# Patient Record
Sex: Male | Born: 1990 | Race: Black or African American | Hispanic: No | Marital: Single | Smoking: Current every day smoker
Health system: Southern US, Community
[De-identification: ages and names within clinical notes are randomized; demographics above are authoritative.]

## PROBLEM LIST (undated history)

## (undated) DIAGNOSIS — F419 Anxiety disorder, unspecified: Secondary | ICD-10-CM

## (undated) DIAGNOSIS — L309 Dermatitis, unspecified: Secondary | ICD-10-CM

## (undated) HISTORY — DX: Dermatitis, unspecified: L30.9

## (undated) HISTORY — PX: TONSILLECTOMY: SUR1361

---
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
## 2013-04-21 ENCOUNTER — Emergency Department (HOSPITAL_COMMUNITY)
Admission: EM | Admit: 2013-04-21 | Discharge: 2013-04-21 | Disposition: A | Discharge status: Home / Self Care | Payer: Self-pay | Attending: Emergency Medicine | Admitting: Emergency Medicine

## 2013-04-21 ENCOUNTER — Emergency Department (HOSPITAL_COMMUNITY): Payer: Self-pay

## 2013-04-21 ENCOUNTER — Encounter (HOSPITAL_COMMUNITY): Payer: Self-pay | Admitting: Emergency Medicine

## 2013-04-21 DIAGNOSIS — F17200 Nicotine dependence, unspecified, uncomplicated: Secondary | ICD-10-CM | POA: Insufficient documentation

## 2013-04-21 DIAGNOSIS — IMO0002 Reserved for concepts with insufficient information to code with codable children: Secondary | ICD-10-CM | POA: Insufficient documentation

## 2013-04-21 DIAGNOSIS — Y929 Unspecified place or not applicable: Secondary | ICD-10-CM | POA: Insufficient documentation

## 2013-04-21 DIAGNOSIS — S63601A Unspecified sprain of right thumb, initial encounter: Secondary | ICD-10-CM

## 2013-04-21 DIAGNOSIS — Y939 Activity, unspecified: Secondary | ICD-10-CM | POA: Insufficient documentation

## 2013-04-21 DIAGNOSIS — S6390XA Sprain of unspecified part of unspecified wrist and hand, initial encounter: Secondary | ICD-10-CM | POA: Insufficient documentation

## 2013-04-21 DIAGNOSIS — R209 Unspecified disturbances of skin sensation: Secondary | ICD-10-CM | POA: Insufficient documentation

## 2013-04-21 NOTE — ED Provider Notes (Signed)
Medical screening examination/treatment/procedure(s) were performed by non-physician practitioner and as supervising physician I was immediately available for consultation/collaboration.   Richardean Canal, MD 04/21/13 [phone]
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
## 2013-04-21 NOTE — ED Notes (Signed)
Patient transported to X-ray 

## 2013-04-21 NOTE — Discharge Instructions (Signed)
See attached document for more information about your diagnosis. Take over the counter pain meds (aleve, ibuprofen, tylenol) for pain.  May take up to 800mg , 3 times a day. Follow-up with hand specialist, Dr. Melvyn Novas if symptoms do not improve. Return to the ED for new or worsening symptoms.

## 2013-11-22 ENCOUNTER — Ambulatory Visit (INDEPENDENT_AMBULATORY_CARE_PROVIDER_SITE_OTHER): Payer: Self-pay | Admitting: Family Medicine

## 2013-11-22 DIAGNOSIS — Z111 Encounter for screening for respiratory tuberculosis: Secondary | ICD-10-CM

## 2013-11-25 ENCOUNTER — Encounter: Payer: Self-pay | Admitting: Physician Assistant

## 2013-11-25 ENCOUNTER — Ambulatory Visit (INDEPENDENT_AMBULATORY_CARE_PROVIDER_SITE_OTHER): Payer: Self-pay | Admitting: Physician Assistant

## 2013-11-25 VITALS — BP 134/80 | HR 64 | Temp 98.4°F | Resp 18 | Ht 71.5 in | Wt 173.0 lb

## 2013-11-25 DIAGNOSIS — Z0000 Encounter for general adult medical examination without abnormal findings: Secondary | ICD-10-CM

## 2013-11-25 DIAGNOSIS — L259 Unspecified contact dermatitis, unspecified cause: Secondary | ICD-10-CM

## 2013-11-25 DIAGNOSIS — F410 Panic disorder [episodic paroxysmal anxiety] without agoraphobia: Secondary | ICD-10-CM

## 2013-11-25 DIAGNOSIS — L309 Dermatitis, unspecified: Secondary | ICD-10-CM

## 2013-11-25 LAB — TB SKIN TEST: Induration: 0 mm

## 2013-11-25 NOTE — Progress Notes (Signed)
Patient ID: Henry Schultz MRN: [id number], DOB: 02-03-1991, 22 y.o. Date of Encounter: @DATE @  Chief Complaint:  Chief Complaint  Patient presents with  . Annual Exam    read TB test,  wants to discuss situational anxiety    HPI: 22 y.o. year old AA male  presents for complete physical. Says that he has to have a physical because of a new job.  He has been seen in the office once prior to today's visit. This was August of 2013 with Dr. Modesto Charon. He was seen for complete physical exam at that time as well.  Today he reports that he has been having some episodes of panic. Says it first started this summer. He notices he has it when he is around a lot of people and sometimes when having a conversation with someone- he will  get this nervous feeling. If he starts to feel very nervoius he begins to sweat. Says it used to occur only about once per month but recently has been more of every 2 or 3 weeks. He is concerned that it is becoming more frequent. Says he tries to "think happy thoughts" but he cannot control it.  No other complaints. Says he was taking classes at [employer]. 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 says he has never had these panic episodes while driving.   Past Medical History  Diagnosis Date  . Eczema      Home Meds: See attached medication section for current medication list. Any medications entered into computer today will not appear on this note's list. The medications listed below were entered prior to today. No current outpatient prescriptions on file prior to visit.   No current facility-administered medications on file prior to visit.    Allergies: No Known Allergies  History   Social History  . Marital Status: Single    Spouse Name: N/A    Number of Children: N/A  . Years of Education: N/A   Occupational History  . Not on file.   Social History Main Topics  . Smoking status:  Current Every Day Smoker    Types: Cigarettes  . Smokeless tobacco: Never Used  . Alcohol Use: Yes  . Drug Use: No  . Sexual Activity: Not on file   Other Topics Concern  . Not on file   Social History Narrative  . No narrative on file    He exercises every day. Says that he does a "full workout every day. " he was a basketball player in the past. He currently does cardio as well as core exercises and weights. Both ofHis parents are living and are in good health. History reviewed. No pertinent family history.   Review of Systems:  See HPI for pertinent ROS. All other ROS negative.    Physical Exam: Blood pressure 134/80, pulse 64, temperature 98.4 F (36.9 C), temperature source Oral, resp. rate 18, height 5' 11.5" (1.816 m), weight 173 lb (78.472 kg)., Body mass index is 23.79 kg/(m^2). General: WNWD AAM Appears in no acute distress. Head: Normocephalic, atraumatic, eyes without discharge, sclera non-icteric, nares are without discharge. Bilateral auditory canals clear, TM's are without perforation, pearly grey and translucent with reflective cone of light bilaterally. Oral cavity moist, posterior pharynx without exudate, erythema, peritonsillar abscess, or post nasal drip.  Neck: Supple. No thyromegaly. No lymphadenopathy. Lungs: Clear bilaterally to auscultation without wheezes, rales, or rhonchi. Breathing is unlabored. Heart:  RRR with S1 S2. No murmurs, rubs, or gallops. Abdomen: Soft, non-tender, non-distended with normoactive bowel sounds. No hepatomegaly. No rebound/guarding. No obvious abdominal masses. Musculoskeletal:  Strength and tone normal for age. Extremities/Skin: Warm and dry. No clubbing or cyanosis. No edema. No rashes or suspicious lesions. Neuro: Alert and oriented X 3. Moves all extremities spontaneously. Gait is normal. CNII-XII grossly in tact. Psych:  Responds to questions appropriately with a normal affect.     ASSESSMENT AND PLAN:  22 y.o. year old  male with  1. Visit for preventive health examination Complete lab work done August 2013. It was normal at that time. We do not need to repeat now. Immunizations: Tetanus is up-to-date. He had this in 2006.  2. Panic disorder Discussed the options for treatment. Discussed counseling/therapy. Discussed daily SSRI. Discussed when necessary benzodiazepines. Patient defers all of these treatment options at this time. Told him to start documenting the dates and understands this at any episodes of panic from here on out. Document these and bring them in for followup visit.  3. Eczema    Signed, [address] [place], [place], [facility] 11/25/2013 12:18 PM

## 2014-05-08 IMAGING — CR DG FINGER THUMB 2+V*R*
3 series · 3 of 3 positions shown · non-contrast
Comparison: None.

CLINICAL DATA: Pain post injury

RIGHT THUMB 2+V

[x finger pa right]
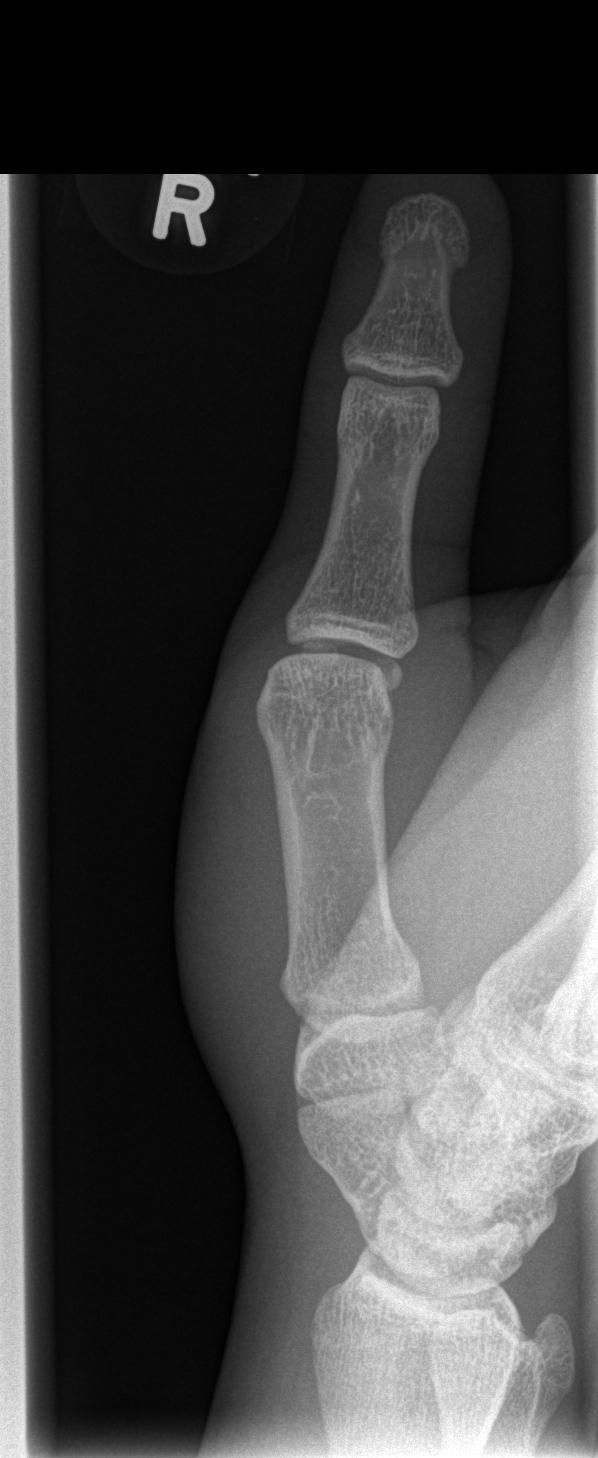

[x finger obl. right]
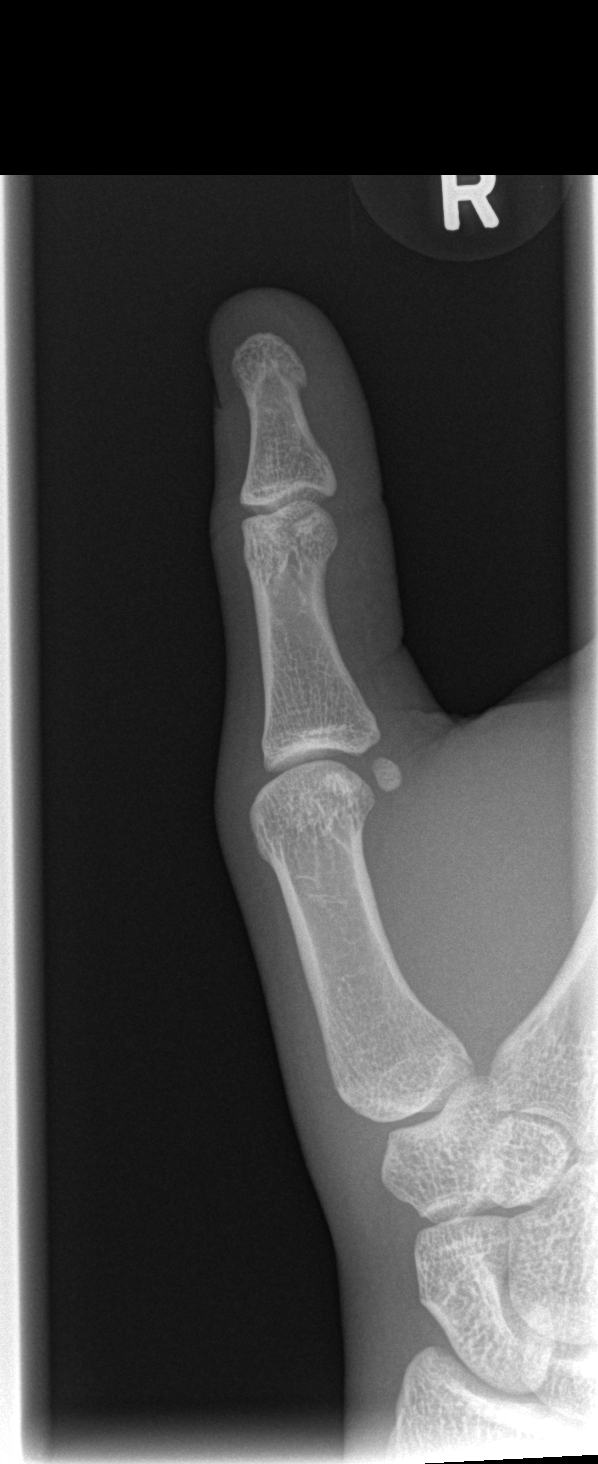

[x finger lateral right]
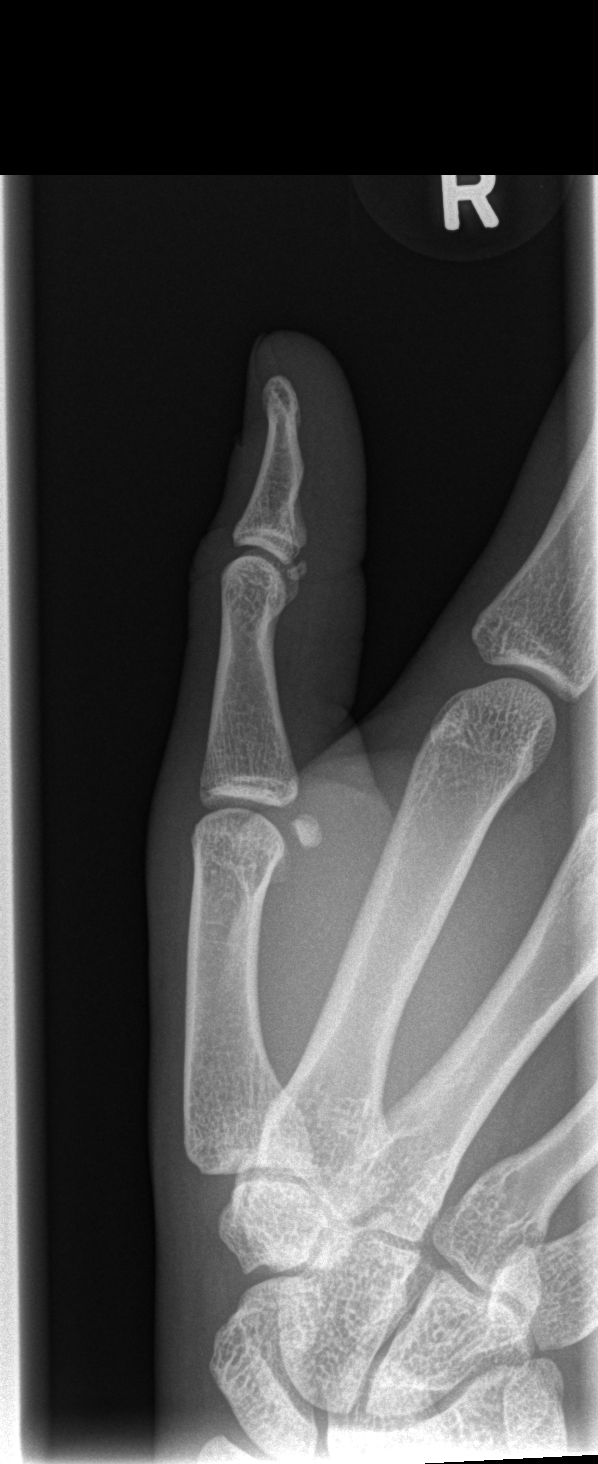

[3 of 3 positions shown; findings below may reference images not displayed]

FINDINGS: Three views of the right thumb submitted.  No acute
fracture or subluxation.  No radiopaque foreign body.
IMPRESSION: No acute fracture or subluxation.

## 2014-06-27 ENCOUNTER — Emergency Department (HOSPITAL_COMMUNITY)
Admission: EM | Admit: 2014-06-27 | Discharge: 2014-06-27 | Disposition: A | Discharge status: Home / Self Care | Payer: Self-pay | Attending: Emergency Medicine | Admitting: Emergency Medicine

## 2014-06-27 ENCOUNTER — Encounter (HOSPITAL_COMMUNITY): Payer: Self-pay | Admitting: Emergency Medicine

## 2014-06-27 ENCOUNTER — Emergency Department (HOSPITAL_COMMUNITY): Payer: Self-pay

## 2014-06-27 DIAGNOSIS — F17200 Nicotine dependence, unspecified, uncomplicated: Secondary | ICD-10-CM | POA: Insufficient documentation

## 2014-06-27 DIAGNOSIS — Y9389 Activity, other specified: Secondary | ICD-10-CM | POA: Insufficient documentation

## 2014-06-27 DIAGNOSIS — Z872 Personal history of diseases of the skin and subcutaneous tissue: Secondary | ICD-10-CM | POA: Insufficient documentation

## 2014-06-27 DIAGNOSIS — M2180 Other specified acquired deformities of unspecified limb: Secondary | ICD-10-CM | POA: Insufficient documentation

## 2014-06-27 DIAGNOSIS — Y929 Unspecified place or not applicable: Secondary | ICD-10-CM | POA: Insufficient documentation

## 2014-06-27 DIAGNOSIS — Z79899 Other long term (current) drug therapy: Secondary | ICD-10-CM | POA: Insufficient documentation

## 2014-06-27 DIAGNOSIS — M21242 Flexion deformity, left finger joints: Secondary | ICD-10-CM

## 2014-06-27 DIAGNOSIS — X58XXXA Exposure to other specified factors, initial encounter: Secondary | ICD-10-CM | POA: Insufficient documentation

## 2014-06-27 MED ORDER — OXYCODONE-ACETAMINOPHEN 5-325 MG PO TABS
1.0000 | ORAL_TABLET | Freq: Once | ORAL | Status: AC
  Administered 2014-06-27: 1 via ORAL
  Filled 2014-06-27: qty 1

## 2014-06-27 MED ORDER — IBUPROFEN 600 MG PO TABS: 600.0000 mg | ORAL_TABLET | Freq: Three times a day (TID) | ORAL | Status: DC | PRN
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
## 2015-03-14 ENCOUNTER — Other Ambulatory Visit: Payer: Self-pay

## 2015-03-14 ENCOUNTER — Emergency Department (HOSPITAL_COMMUNITY)
Admission: EM | Admit: 2015-03-14 | Discharge: 2015-03-14 | Disposition: A | Discharge status: Home / Self Care | Payer: No Typology Code available for payment source | Attending: Emergency Medicine | Admitting: Emergency Medicine

## 2015-03-14 ENCOUNTER — Encounter (HOSPITAL_COMMUNITY): Payer: Self-pay | Admitting: *Deleted

## 2015-03-14 DIAGNOSIS — F419 Anxiety disorder, unspecified: Secondary | ICD-10-CM | POA: Diagnosis present

## 2015-03-14 DIAGNOSIS — R079 Chest pain, unspecified: Secondary | ICD-10-CM | POA: Insufficient documentation

## 2015-03-14 DIAGNOSIS — Z872 Personal history of diseases of the skin and subcutaneous tissue: Secondary | ICD-10-CM | POA: Diagnosis not present

## 2015-03-14 DIAGNOSIS — F410 Panic disorder [episodic paroxysmal anxiety] without agoraphobia: Secondary | ICD-10-CM | POA: Diagnosis not present

## 2015-03-14 DIAGNOSIS — Z720 Tobacco use: Secondary | ICD-10-CM | POA: Diagnosis not present

## 2015-03-14 HISTORY — DX: Anxiety disorder, unspecified: F41.9
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
## 2015-03-14 NOTE — Discharge Instructions (Signed)

## 2015-07-14 IMAGING — CR DG FINGER LITTLE 2+V*L*
3 series · 3 of 3 positions shown · non-contrast
Comparison: Left fifth finger series dated May 11, 2009

CLINICAL DATA: Acute left fifth digit injury with history of
multiple previous injuries

EXAM:
LEFT LITTLE FINGER 2+V

[x finger pa left]
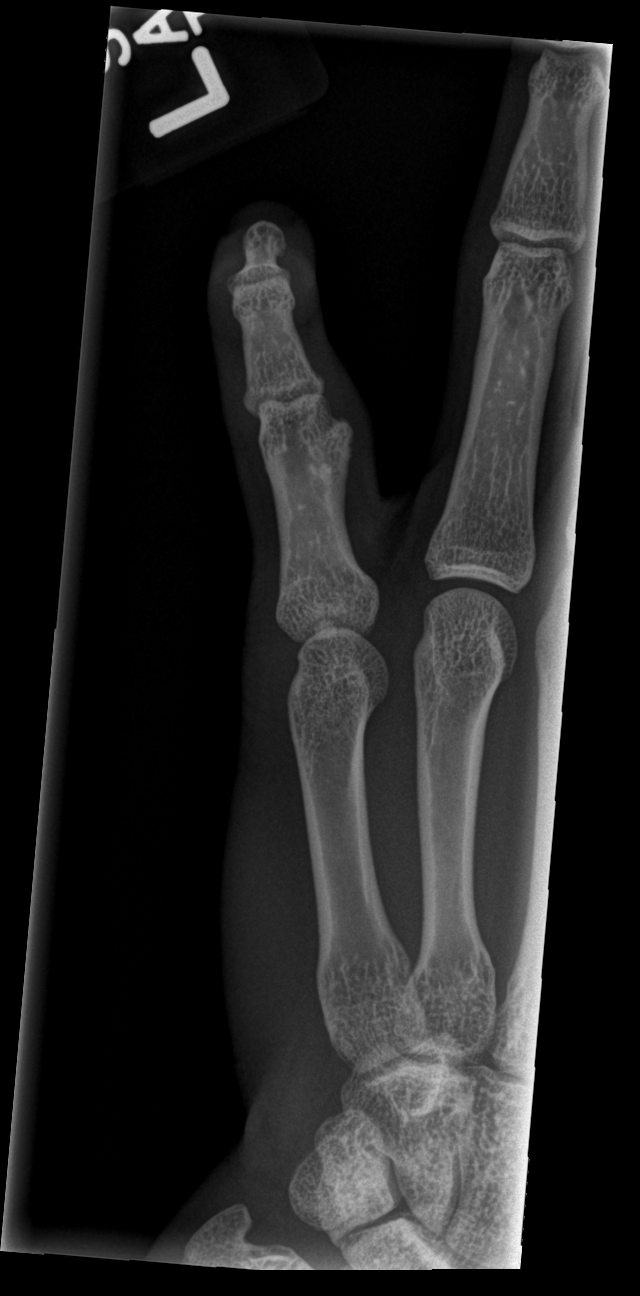

[x finger obl left]
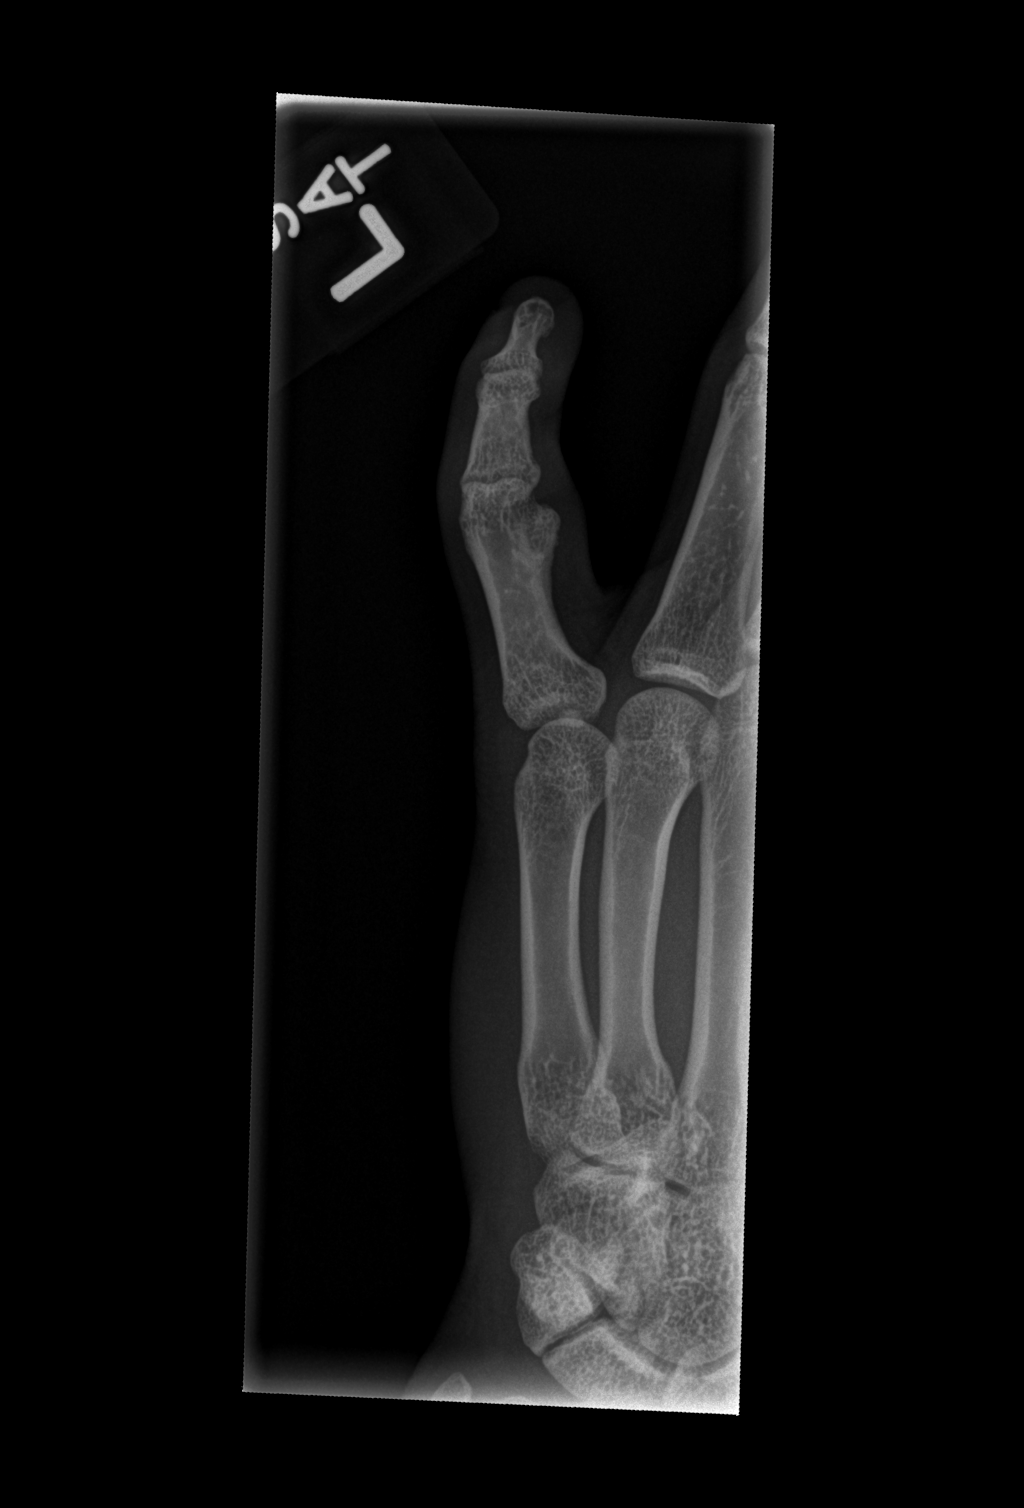

[x finger lat left]
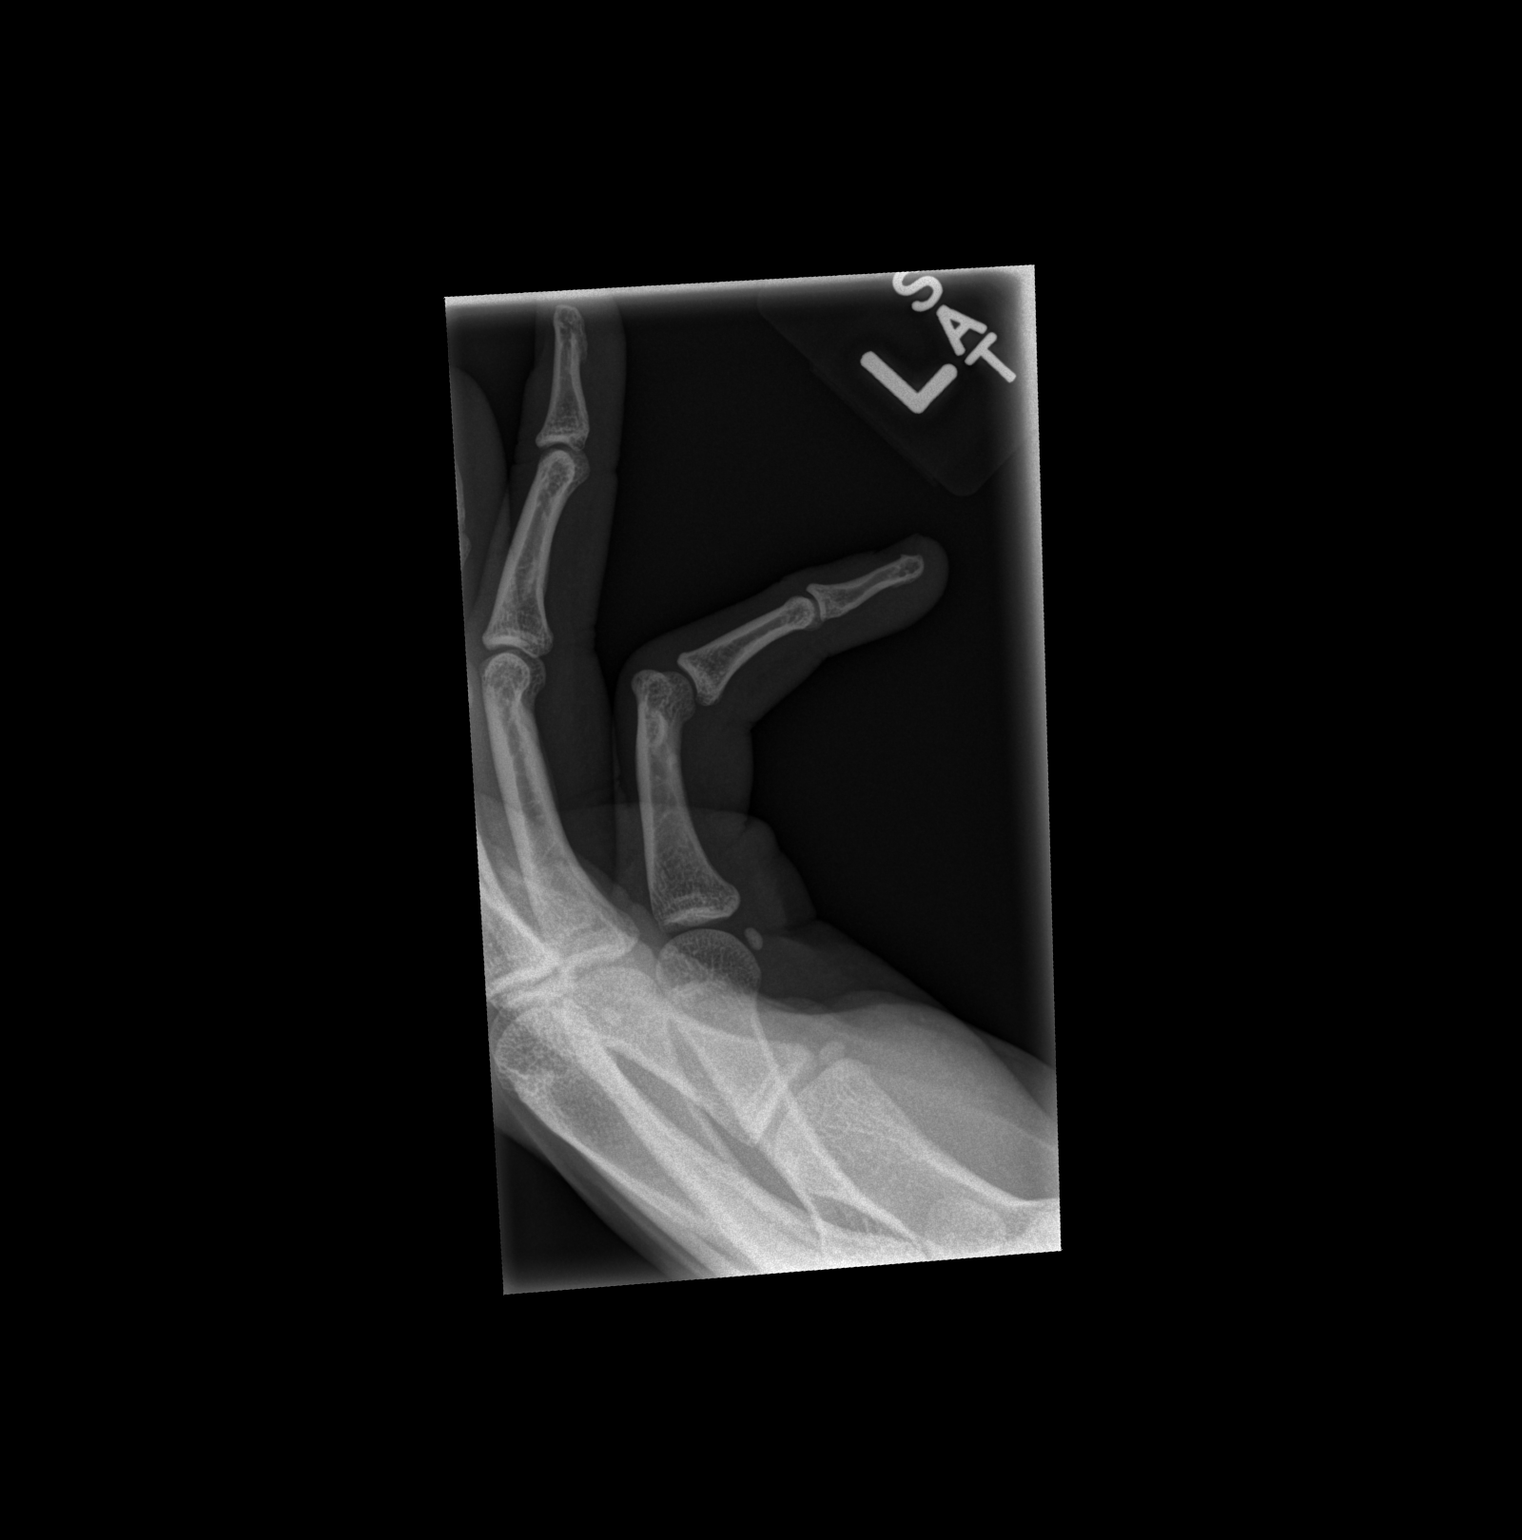

[3 of 3 positions shown; findings below may reference images not displayed]

FINDINGS: The middle and distal phalanges and the DIP joint are normal. The
distal aspect of the proximal phalanx exhibits chronic deformity. No
fracture line is demonstrated. There is hyperflexion at the PIP
joint which may indicate extensor tendon injury. The appearance is
not greatly changed from the previous study. The fifth metacarpal
and the fifth metacarpophalangeal joint are normal.
IMPRESSION: There are chronic abnormalities at the PIP joint of the fifth digit
with hyperflexion. Extensor tendon injury is not excluded. No acute
fracture is demonstrated.
